# Patient Record
Sex: Female | Born: 1985 | Hispanic: Yes | Marital: Single | Smoking: Never smoker
Health system: Southern US, Community
[De-identification: ages and names within clinical notes are randomized; demographics above are authoritative.]

## PROBLEM LIST (undated history)

## (undated) DIAGNOSIS — M5412 Radiculopathy, cervical region: Secondary | ICD-10-CM

## (undated) DIAGNOSIS — M5020 Other cervical disc displacement, unspecified cervical region: Secondary | ICD-10-CM

## (undated) DIAGNOSIS — F419 Anxiety disorder, unspecified: Secondary | ICD-10-CM

## (undated) DIAGNOSIS — R519 Headache, unspecified: Secondary | ICD-10-CM

## (undated) DIAGNOSIS — F329 Major depressive disorder, single episode, unspecified: Secondary | ICD-10-CM

## (undated) DIAGNOSIS — F32A Depression, unspecified: Secondary | ICD-10-CM

## (undated) HISTORY — DX: Depression, unspecified: F32.A

## (undated) HISTORY — PX: TONSILLECTOMY: SUR1361

---
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
## 2019-07-28 ENCOUNTER — Other Ambulatory Visit: Payer: Self-pay

## 2019-07-28 ENCOUNTER — Emergency Department (INDEPENDENT_AMBULATORY_CARE_PROVIDER_SITE_OTHER)
Admission: EM | Admit: 2019-07-28 | Discharge: 2019-07-28 | Disposition: A | Discharge status: Home / Self Care | Payer: PRIVATE HEALTH INSURANCE | Source: Home / Self Care | Attending: Family Medicine | Admitting: Family Medicine

## 2019-07-28 DIAGNOSIS — J209 Acute bronchitis, unspecified: Secondary | ICD-10-CM

## 2019-07-28 DIAGNOSIS — R030 Elevated blood-pressure reading, without diagnosis of hypertension: Secondary | ICD-10-CM

## 2019-07-28 MED ORDER — BENZONATATE 200 MG PO CAPS: ORAL_CAPSULE | ORAL | 0 refills | Status: DC

## 2019-07-28 MED ORDER — AZITHROMYCIN 250 MG PO TABS: ORAL_TABLET | ORAL | 0 refills | Status: DC

## 2019-07-28 NOTE — Discharge Instructions (Addendum)
Take plain guaifenesin (1200mg  extended release tabs such as Mucinex) twice daily, with plenty of water, for cough and congestion.  Get adequate rest.   May use Afrin nasal spray (or generic oxymetazoline) each morning for about 5 days and then discontinue.  Also recommend using saline nasal spray several times daily and saline nasal irrigation (AYR is a common brand).  Use Flonase nasal spray each morning after using Afrin nasal spray and saline nasal irrigation. Try warm salt water gargles for sore throat.  Stop all antihistamines for now, and other non-prescription cough/cold preparations.  Please monitor your blood pressure several times weekly, at different times of the day, record on a calendar with times of measurement and take this record to your Family Doctor.

## 2019-07-28 NOTE — ED Triage Notes (Signed)
Pt c/o cough and runny nose since halloween. Taking mucinex and OTC nasal spray as needed. Pt also hypertensive today. Says she only takes BP when sick and its always high.

## 2019-07-28 NOTE — ED Provider Notes (Signed)
[facility]    CSN: [id number] Arrival date & time: 07/28/19  1048      History   Chief Complaint Chief Complaint  Patient presents with  . Cough    HPI Victoria Bullock is a 33 y.o. female.   About 10 days ago patient developed typical cold-like symptoms developing over several days, including mild sore throat, sinus congestion, fatigue, and cough.  Her cough has been present for a week and she has felt worse during the past two days.  She denies fevers, chills, and sweats and pleuritic pain.  She has a family history of asthma (sister)>   The history is provided by the patient.    History reviewed. No pertinent past medical history.  There are no active problems to display for this patient.   History reviewed. No pertinent surgical history.  OB History   No obstetric history on file.      Home Medications    Prior to Admission medications   Medication Sig Start Date End Date Taking? Authorizing Provider  azithromycin (ZITHROMAX Z-PAK) 250 MG tablet Take 2 tabs today; then begin one tab once daily for 4 more days. 07/28/19   Lattie Haw, MD  benzonatate (TESSALON) 200 MG capsule Take one cap by mouth at bedtime as needed for cough.  May repeat in 4 to 6 hours 07/28/19   Lattie Haw, MD    Family History History reviewed. No pertinent family history.  Social History Social History   Tobacco Use  . Smoking status: Never Smoker  . Smokeless tobacco: Never Used  Substance Use Topics  . Alcohol use: Yes    Comment: occ  . Drug use: Not on file     Allergies   Amoxicillin   Review of Systems Review of Systems + sore throat + cough No pleuritic pain No wheezing + nasal congestion + post-nasal drainage No sinus pain/pressure No itchy/red eyes No earache No hemoptysis No SOB No fever/chills No nausea No vomiting No abdominal pain No diarrhea No urinary symptoms No skin rash + fatigue No myalgias No headache Used OTC  meds (Sudafed, Daquil, Mucinex Cold/Sinus) without relief  Physical Exam Triage Vital Signs ED Triage Vitals  Enc Vitals Group     BP 07/28/19 1148 (!) 181/117     Pulse Rate 07/28/19 1148 (!) 102     Resp 07/28/19 1148 18     Temp 07/28/19 1148 98.7 F (37.1 C)     Temp Source 07/28/19 1148 Oral     SpO2 07/28/19 1148 100 %     Weight --      Height --      Head Circumference --      Peak Flow --      Pain Score 07/28/19 1149 0     Pain Loc --      Pain Edu? --      Excl. in GC? --    No data found.  Updated Vital Signs BP (!) 159/111 (BP Location: Right Arm)   Pulse (!) 102   Temp 98.7 F (37.1 C) (Oral)   Resp 18   LMP  (LMP Unknown) Comment: BC skips periods  SpO2 100%   Visual Acuity Right Eye Distance:   Left Eye Distance:   Bilateral Distance:    Right Eye Near:   Left Eye Near:    Bilateral Near:     Physical Exam Nursing notes and Vital Signs reviewed. Appearance:  Patient appears stated age, and  in no acute distress Eyes:  Pupils are equal, round, and reactive to light and accomodation.  Extraocular movement is intact.  Conjunctivae are not inflamed  Ears:  Canals normal.  Tympanic membranes normal.  Nose:  Mildly congested turbinates.  No sinus tenderness.    Pharynx:  Normal Neck:  Supple.  Enlarged posterior/lateral nodes are palpated bilaterally, tender to palpation on the left. Lungs:  Rhonchi posterior upper chest bilaterally.  Breath sounds are equal.  Moving air well. Heart:  Regular rate and rhythm without murmurs, rubs, or gallops.  Abdomen:  Nontender without masses or hepatosplenomegaly.  Bowel sounds are present.  No CVA or flank tenderness.  Extremities:  No edema.  Skin:  No rash present.      UC Treatments / Results  Labs (all labs ordered are listed, but only abnormal results are displayed) Labs Reviewed - No data to display  EKG   Radiology No results found.  Procedures Procedures (including critical care time)   Medications Ordered in UC Medications - No data to display  Initial Impression / Assessment and Plan / UC Course  I have reviewed the triage vital signs and the nursing notes.  Pertinent labs & imaging results that were available during my care of the patient were reviewed by me and considered in my medical decision making (see chart for details).    Suspect initial viral URI, now developing bacterial bronchitis.  Begin Z-pak. Prescription written for Benzonatate [facility]) to take at bedtime for night-time cough.  Followup with Family Doctor if not improved in 7 to 10 days.  Recommend follow-up with PCP for evaluation of blood pressure.   Final Clinical Impressions(s) / UC Diagnoses   Final diagnoses:  Acute bronchitis, unspecified organism  Elevated blood pressure reading in office without diagnosis of hypertension     Discharge Instructions     Take plain guaifenesin (1200mg  extended release tabs such as Mucinex) twice daily, with plenty of water, for cough and congestion.  Get adequate rest.   May use Afrin nasal spray (or generic oxymetazoline) each morning for about 5 days and then discontinue.  Also recommend using saline nasal spray several times daily and saline nasal irrigation (AYR is a common brand).  Use Flonase nasal spray each morning after using Afrin nasal spray and saline nasal irrigation. Try warm salt water gargles for sore throat.  Stop all antihistamines for now, and other non-prescription cough/cold preparations.  Please monitor your blood pressure several times weekly, at different times of the day, record on a calendar with times of measurement and take this record to your Family Doctor.       ED Prescriptions    Medication Sig Dispense Auth. Provider   azithromycin (ZITHROMAX Z-PAK) 250 MG tablet Take 2 tabs today; then begin one tab once daily for 4 more days. 6 tablet Kandra Nicolas, MD   benzonatate (TESSALON) 200 MG capsule Take one cap by mouth  at bedtime as needed for cough.  May repeat in 4 to 6 hours 15 capsule Assunta Found Ishmael Holter, MD        Kandra Nicolas, MD 08/02/19 [phone]

## 2019-08-22 ENCOUNTER — Emergency Department (INDEPENDENT_AMBULATORY_CARE_PROVIDER_SITE_OTHER)
Admission: EM | Admit: 2019-08-22 | Discharge: 2019-08-22 | Disposition: A | Discharge status: Home / Self Care | Payer: PRIVATE HEALTH INSURANCE | Source: Home / Self Care

## 2019-08-22 ENCOUNTER — Other Ambulatory Visit: Payer: Self-pay

## 2019-08-22 DIAGNOSIS — M436 Torticollis: Secondary | ICD-10-CM

## 2019-08-22 MED ORDER — DICLOFENAC SODIUM 75 MG PO TBEC: 75.0000 mg | DELAYED_RELEASE_TABLET | Freq: Two times a day (BID) | ORAL | 0 refills | Status: DC

## 2019-08-22 MED ORDER — METHOCARBAMOL 500 MG PO TABS: 500.0000 mg | ORAL_TABLET | Freq: Four times a day (QID) | ORAL | 0 refills | Status: DC

## 2019-08-22 MED ORDER — KETOROLAC TROMETHAMINE 60 MG/2ML IM SOLN
60.0000 mg | Freq: Once | INTRAMUSCULAR | Status: AC
  Administered 2019-08-22: 60 mg via INTRAMUSCULAR

## 2019-08-22 NOTE — Discharge Instructions (Signed)
Return if any problems.

## 2019-08-22 NOTE — ED Triage Notes (Signed)
Pt c/o LT sided back pain that radiates to her LT shoulder. Started randomly at 400am. Took ibuprofen at 8am. Pain 6/10

## 2019-08-23 NOTE — ED Provider Notes (Signed)
[facility]    CSN: [id number] Arrival date & time: 08/22/19  0945      History   Chief Complaint Chief Complaint  Patient presents with  . Back Pain  . Shoulder Pain    HPI Victoria Bullock is a 33 y.o. female.   The history is provided by the patient. No language interpreter was used.  Neck Injury This is a new problem. The current episode started 2 days ago. The problem occurs constantly. Nothing aggravates the symptoms. Nothing relieves the symptoms. She has tried nothing for the symptoms. The treatment provided no relief.  Pt complains of soreness and muscle spasm in her neck.  Pt reports she awoke from sleep with pain.  No known injury  History reviewed. No pertinent past medical history.  There are no active problems to display for this patient.   History reviewed. No pertinent surgical history.  OB History   No obstetric history on file.      Home Medications    Prior to Admission medications   Medication Sig Start Date End Date Taking? Authorizing Provider  azithromycin (ZITHROMAX Z-PAK) 250 MG tablet Take 2 tabs today; then begin one tab once daily for 4 more days. 07/28/19   Lattie Haw, MD  benzonatate (TESSALON) 200 MG capsule Take one cap by mouth at bedtime as needed for cough.  May repeat in 4 to 6 hours 07/28/19   Lattie Haw, MD  diclofenac (VOLTAREN) 75 MG EC tablet Take 1 tablet (75 mg total) by mouth 2 (two) times daily. 08/22/19   Elson Areas, PA-C  methocarbamol (ROBAXIN) 500 MG tablet Take 1 tablet (500 mg total) by mouth 4 (four) times daily. 08/22/19   Elson Areas, PA-C    Family History History reviewed. No pertinent family history.  Social History Social History   Tobacco Use  . Smoking status: Never Smoker  . Smokeless tobacco: Never Used  Substance Use Topics  . Alcohol use: Yes    Comment: occ  . Drug use: Not on file     Allergies   Amoxicillin   Review of Systems Review of Systems  All  other systems reviewed and are negative.    Physical Exam Triage Vital Signs ED Triage Vitals [08/22/19 1003]  Enc Vitals Group     BP (!) 150/96     Pulse Rate (!) 120     Resp 18     Temp 98.5 F (36.9 C)     Temp Source Oral     SpO2 97 %     Weight 215 lb (97.5 kg)     Height 5\' 5"  (1.651 m)     Head Circumference      Peak Flow      Pain Score 6     Pain Loc      Pain Edu?      Excl. in GC?    No data found.  Updated Vital Signs BP (!) 150/96 (BP Location: Right Arm)   Pulse (!) 120   Temp 98.5 F (36.9 C) (Oral)   Resp 18   Ht 5\' 5"  (1.651 m)   Wt 97.5 kg   LMP  (LMP Unknown) Comment: BC skips periods  SpO2 97%   BMI 35.78 kg/m   Visual Acuity Right Eye Distance:   Left Eye Distance:   Bilateral Distance:    Right Eye Near:   Left Eye Near:    Bilateral Near:     Physical  Exam Vitals signs and nursing note reviewed.  Constitutional:      Appearance: She is well-developed.  HENT:     Head: Normocephalic.  Neck:     Musculoskeletal: Normal range of motion. Muscular tenderness present. No neck rigidity.     Comments: Tense trapezius muscles, decreased range of motion, nv and ns intact Pulmonary:     Effort: Pulmonary effort is normal.  Abdominal:     General: There is no distension.  Musculoskeletal: Normal range of motion.  Skin:    General: Skin is warm.  Neurological:     General: No focal deficit present.     Mental Status: She is alert and oriented to person, place, and time.  Psychiatric:        Mood and Affect: Mood normal.      UC Treatments / Results  Labs (all labs ordered are listed, but only abnormal results are displayed) Labs Reviewed - No data to display  EKG   Radiology No results found.  Procedures Procedures (including critical care time)  Medications Ordered in UC Medications  ketorolac (TORADOL) injection 60 mg (60 mg Intramuscular Given 08/22/19 1036)    Initial Impression / Assessment and Plan / UC  Course  I have reviewed the triage vital signs and the nursing notes.  Pertinent labs & imaging results that were available during my care of the patient were reviewed by me and considered in my medical decision making (see chart for details).     MDM  Pt counseled on torticolilis.  Pt given injection of toradol, rx for robaxin and voltaren Final Clinical Impressions(s) / UC Diagnoses   Final diagnoses:  Torticollis     Discharge Instructions     Return if any problems    ED Prescriptions    Medication Sig Dispense Auth. Provider   methocarbamol (ROBAXIN) 500 MG tablet Take 1 tablet (500 mg total) by mouth 4 (four) times daily. 20 tablet Roel Douthat K, [place]   diclofenac (VOLTAREN) 75 MG EC tablet Take 1 tablet (75 mg total) by mouth 2 (two) times daily. 20 tablet Fransico Meadow, [place]     PDMP not reviewed this encounter.  An After Visit Summary was printed and given to the patient.    Fransico Meadow, [place] 08/23/19 1310

## 2019-08-24 ENCOUNTER — Other Ambulatory Visit: Payer: Self-pay

## 2019-08-24 ENCOUNTER — Emergency Department (INDEPENDENT_AMBULATORY_CARE_PROVIDER_SITE_OTHER): Payer: PRIVATE HEALTH INSURANCE

## 2019-08-24 ENCOUNTER — Emergency Department (INDEPENDENT_AMBULATORY_CARE_PROVIDER_SITE_OTHER)
Admission: EM | Admit: 2019-08-24 | Discharge: 2019-08-24 | Disposition: A | Discharge status: Home / Self Care | Payer: PRIVATE HEALTH INSURANCE | Source: Home / Self Care | Attending: Family Medicine | Admitting: Family Medicine

## 2019-08-24 DIAGNOSIS — M5412 Radiculopathy, cervical region: Secondary | ICD-10-CM

## 2019-08-24 DIAGNOSIS — M4312 Spondylolisthesis, cervical region: Secondary | ICD-10-CM | POA: Diagnosis not present

## 2019-08-24 MED ORDER — PREDNISONE 20 MG PO TABS: ORAL_TABLET | ORAL | 0 refills | Status: DC

## 2019-08-24 MED ORDER — OXYCODONE-ACETAMINOPHEN 5-325 MG PO TABS: ORAL_TABLET | ORAL | 0 refills | Status: DC

## 2019-08-24 NOTE — Discharge Instructions (Addendum)
Apply ice pack for 20 to 30 minutes, 3 to 4 times daily  Continue until pain decreases.  Wear cervical collar.

## 2019-08-24 NOTE — ED Triage Notes (Signed)
Pt c/o LT shoulder, back and neck pain since Thurs am. Was seen here in UC and given Toradol injection. Sent home with robaxin and diclofenac. No relief since and now has numbness starting in her fingers.

## 2019-08-24 NOTE — ED Provider Notes (Signed)
[facility]    CSN: [id number] Arrival date & time: 08/24/19  0844      History   Chief Complaint Chief Complaint  Patient presents with  . Shoulder Pain    LT  . Back Pain  . Numbness    LT fingers    HPI Victoria Bullock is a 33 y.o. female.   Patient complains of at least 5 year history of intermittently recurring pain in left neck and shoulder, now constant for two days.  The pain now radiates to her upper left anterior chest and into left arm, with tingling paresthesias in her left fingers.  The symptoms are distinctly worse when extending her neck, rotating her head to the left, and laterally flexing her neck to the left.  Her pain improves when touching her chin on her chest.  She denies past injury to neck. She was evaluated here two days ago, and had no improvement after injection Toradol, and Rx for Voltaren and Robaxin.      History reviewed. No pertinent past medical history.  There are no active problems to display for this patient.   History reviewed. No pertinent surgical history.  OB History   No obstetric history on file.      Home Medications    Prior to Admission medications   Medication Sig Start Date End Date Taking? Authorizing Provider  azithromycin (ZITHROMAX Z-PAK) 250 MG tablet Take 2 tabs today; then begin one tab once daily for 4 more days. 07/28/19   Kandra Nicolas, MD  benzonatate (TESSALON) 200 MG capsule Take one cap by mouth at bedtime as needed for cough.  May repeat in 4 to 6 hours 07/28/19   Kandra Nicolas, MD  diclofenac (VOLTAREN) 75 MG EC tablet Take 1 tablet (75 mg total) by mouth 2 (two) times daily. 08/22/19   Fransico Meadow, PA-C  methocarbamol (ROBAXIN) 500 MG tablet Take 1 tablet (500 mg total) by mouth 4 (four) times daily. 08/22/19   Fransico Meadow, PA-C  oxyCODONE-acetaminophen (PERCOCET) 5-325 MG tablet Take one tab PO HS PRN pain 08/24/19   Kandra Nicolas, MD  predniSONE (DELTASONE) 20 MG tablet Take  one tab by mouth twice daily for 4 days, then one daily for 3 days. Take with food. 08/24/19   Kandra Nicolas, MD    Family History History reviewed. No pertinent family history.  Social History Social History   Tobacco Use  . Smoking status: Never Smoker  . Smokeless tobacco: Never Used  Substance Use Topics  . Alcohol use: Yes    Comment: occ  . Drug use: Not on file     Allergies   Amoxicillin   Review of Systems Review of Systems  Constitutional: Negative for activity change, chills, diaphoresis, fatigue and fever.  HENT: Negative.   Eyes: Negative.   Respiratory: Negative.   Cardiovascular: Negative.   Gastrointestinal: Negative.   Genitourinary: Negative.   Musculoskeletal: Positive for neck pain and neck stiffness.  Skin: Negative.   Neurological: Positive for numbness.     Physical Exam Triage Vital Signs ED Triage Vitals  Enc Vitals Group     BP 08/24/19 0853 (!) 155/96     Pulse Rate 08/24/19 0853 (!) 114     Resp 08/24/19 0853 18     Temp 08/24/19 0853 98.4 F (36.9 C)     Temp Source 08/24/19 0853 Oral     SpO2 08/24/19 0853 98 %     Weight 08/24/19  0854 213 lb (96.6 kg)     Height 08/24/19 0854 5\' 5"  (1.651 m)     Head Circumference --      Peak Flow --      Pain Score 08/24/19 0854 6     Pain Loc --      Pain Edu? --      Excl. in GC? --    No data found.  Updated Vital Signs BP (!) 155/96 (BP Location: Right Arm)   Pulse (!) 114   Temp 98.4 F (36.9 C) (Oral)   Resp 18   Ht 5\' 5"  (1.651 m)   Wt 96.6 kg   LMP  (LMP Unknown) Comment: BC skips periods  SpO2 98%   BMI 35.45 kg/m   Visual Acuity Right Eye Distance:   Left Eye Distance:   Bilateral Distance:    Right Eye Near:   Left Eye Near:    Bilateral Near:     Physical Exam Vitals signs and nursing note reviewed.  Constitutional:      General: She is not in acute distress.    Appearance: She is obese.     Comments: Patient appears uncomfortable, standing by exam  table.  HENT:     Head: Atraumatic.     Right Ear: Tympanic membrane normal.     Left Ear: Tympanic membrane normal.     Nose: Nose normal.     Mouth/Throat:     Mouth: Mucous membranes are moist.  Eyes:     Conjunctiva/sclera: Conjunctivae normal.     Pupils: Pupils are equal, round, and reactive to light.  Neck:     Musculoskeletal: Neck supple. Decreased range of motion. Spinous process tenderness and muscular tenderness present. No crepitus.      Comments: Left neck has diffuse tenderness to palpation in areas  as noted on diagram.  Cardiovascular:     Heart sounds: Normal heart sounds.  Pulmonary:     Breath sounds: Normal breath sounds.  Musculoskeletal:     Right shoulder: She exhibits tenderness. She exhibits normal range of motion and no bony tenderness.  Lymphadenopathy:     Cervical: No cervical adenopathy.  Skin:    General: Skin is warm and dry.     Findings: No rash.  Neurological:     Mental Status: She is alert.     Comments: Normal grip strength bilaterally      UC Treatments / Results  Labs (all labs ordered are listed, but only abnormal results are displayed) Labs Reviewed - No data to display  EKG   Radiology Dg Cervical Spine Complete  Result Date: 08/24/2019 CLINICAL DATA:  Pt states that for the past 3 days she has had left sided neck pain with numbness and tingling in her left arm. Denies injury. Pt states pain increases when tilting head up. Intermittent left neck/shoulder pain for at least 5 years. Now has constant pain radiating to upper left anterior chest and left arm. EXAM: CERVICAL SPINE - COMPLETE 4+ VIEW COMPARISON:  None. FINDINGS: Straightening of the normal cervical lordosis. Mild 1 mm of anterolisthesis of C2 on C3. No neural foraminal narrowing on oblique projections. No loss vertebral body height and disc height. Normal spinal laminal line. Open mouth odontoid view demonstrates normal alignment of the lateral masses of C1 on C2  IMPRESSION: 1. No acute findings the cervical spine. 2. Mild anterolisthesis of C2 on C3. Electronically Signed   By: Genevive BiStewart  Edmunds M.D.   On: 08/24/2019 09:58  Procedures Procedures (including critical care time)  Medications Ordered in UC Medications - No data to display  Initial Impression / Assessment and Plan / UC Course  I have reviewed the triage vital signs and the nursing notes.  Pertinent labs & imaging results that were available during my care of the patient were reviewed by me and considered in my medical decision making (see chart for details).    Begin prednisone burst/taper.  Rx for Percocet at bedtime (#5, no refill). Controlled Substance Prescriptions I have consulted the [place] Controlled Substances Registry for this patient, and feel the risk/benefit ratio today is favorable for proceeding with this prescription for a controlled substance.   Followup with Dr. [facility]ney [facility] ([facility]) in about 5 days for further evaluation/management.   Final Clinical Impressions(s) / UC Diagnoses   Final diagnoses:  Cervical radiculopathy     Discharge Instructions     Apply ice pack for 20 to 30 minutes, 3 to 4 times daily  Continue until pain decreases.  Wear cervical collar.    ED Prescriptions    Medication Sig Dispense Auth. Provider   predniSONE (DELTASONE) 20 MG tablet Take one tab by mouth twice daily for 4 days, then one daily for 3 days. Take with food. 11 tablet Lattie Haw, MD   oxyCODONE-acetaminophen (PERCOCET) 5-325 MG tablet Take one tab PO HS PRN pain 5 tablet Lattie Haw, MD        Lattie Haw, MD 08/30/19 1046

## 2019-08-27 ENCOUNTER — Ambulatory Visit (INDEPENDENT_AMBULATORY_CARE_PROVIDER_SITE_OTHER): Payer: PRIVATE HEALTH INSURANCE | Admitting: Sports Medicine

## 2019-08-27 ENCOUNTER — Encounter: Payer: Self-pay | Admitting: Sports Medicine

## 2019-08-27 ENCOUNTER — Other Ambulatory Visit: Payer: Self-pay

## 2019-08-27 ENCOUNTER — Ambulatory Visit (INDEPENDENT_AMBULATORY_CARE_PROVIDER_SITE_OTHER): Payer: PRIVATE HEALTH INSURANCE

## 2019-08-27 DIAGNOSIS — M5412 Radiculopathy, cervical region: Secondary | ICD-10-CM

## 2019-08-27 MED ORDER — CYCLOBENZAPRINE HCL 10 MG PO TABS: ORAL_TABLET | ORAL | 0 refills | Status: DC

## 2019-08-27 MED ORDER — OXYCODONE-ACETAMINOPHEN 10-325 MG PO TABS: 1.0000 | ORAL_TABLET | Freq: Three times a day (TID) | ORAL | 0 refills | Status: DC | PRN

## 2019-08-27 MED ORDER — KETOROLAC TROMETHAMINE 60 MG/2ML IM SOLN
60.0000 mg | Freq: Once | INTRAMUSCULAR | Status: AC
  Administered 2019-08-27: 60 mg via INTRAMUSCULAR

## 2019-08-27 NOTE — Progress Notes (Addendum)
Subjective:    CC: Severe neck pain  HPI:  This is a pleasant 33 year old female, on and off she is had flares of neck pain, occasional radiation into the left arm but this is the worst yet, she was seen in urgent care, 2 times in the past week, she has severe pain, radiating into the second and third fingers, has never had physical therapy or advanced imaging.  She does have progressive weakness.  I reviewed the past medical history, family history, social history, surgical history, and allergies today and no changes were needed.  Please see the problem list section below in epic for further details.  Past Medical History: Past Medical History:  Diagnosis Date  . Depression    Past Surgical History: Past Surgical History:  Procedure Laterality Date  . TONSILLECTOMY     Social History: Social History   Socioeconomic History  . Marital status: Single    Spouse name: Not on file  . Number of children: Not on file  . Years of education: Not on file  . Highest education level: Not on file  Occupational History  . Not on file  Social Needs  . Financial resource strain: Not on file  . Food insecurity    Worry: Not on file    Inability: Not on file  . Transportation needs    Medical: Not on file    Non-medical: Not on file  Tobacco Use  . Smoking status: Never Smoker  . Smokeless tobacco: Never Used  Substance and Sexual Activity  . Alcohol use: Yes    Comment: occ  . Drug use: Not on file  . Sexual activity: Not on file  Lifestyle  . Physical activity    Days per week: Not on file    Minutes per session: Not on file  . Stress: Not on file  Relationships  . Social Herbalist on phone: Not on file    Gets together: Not on file    Attends religious service: Not on file    Active member of club or organization: Not on file    Attends meetings of clubs or organizations: Not on file    Relationship status: Not on file  Other Topics Concern  . Not on file   Social History Narrative  . Not on file   Family History: No family history on file. Allergies: Allergies  Allergen Reactions  . Amoxicillin    Medications: See med rec.  Review of Systems: No headache, visual changes, nausea, vomiting, diarrhea, constipation, dizziness, abdominal pain, skin rash, fevers, chills, night sweats, swollen lymph nodes, weight loss, chest pain, body aches, joint swelling, muscle aches, shortness of breath, mood changes, visual or auditory hallucinations.  Objective:    General: Well Developed, well nourished, and in no acute distress.  Neuro: Alert and oriented x3, extra-ocular muscles intact, sensation grossly intact.  HEENT: Normocephalic, atraumatic, pupils equal round reactive to light, neck supple, no masses, no lymphadenopathy, thyroid nonpalpable.  Skin: Warm and dry, no rashes noted.  Cardiac: Regular rate and rhythm, no murmurs rubs or gallops.  Respiratory: Clear to auscultation bilaterally. Not using accessory muscles, speaking in full sentences.  Abdominal: Soft, nontender, nondistended, positive bowel sounds, no masses, no organomegaly.  Neck: Range of motion limited by pain, unable to perform Spurling's maneuver due to pain. Grip strength and sensation normal in bilateral hands Week to elbow extension and wrist extension. Subjective hypoesthesia in a C7 distribution on the left. Reflexes normal  X-rays personally reviewed show mild degenerative changes in the upper cervical spine.  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  Impression and Recommendations:    The patient was counselled, risk factors were discussed, anticipatory guidance given.  Left cervical radiculopathy Severe left cervical radiculopathy, weakness in the left arm, particularly to wrist extension and elbow extension. Continue prednisone, Toradol 60 intramuscular. Adding an MRI today considering weakness. Adding formal physical  therapy.  There is a large herniated disc at C6-C7 definitely compressing the left C7 nerve root, with her weakness I do think it is reasonable to get a consultation with spine surgery, placing urgent referral to Dr. Yevette Edwards, let see if he can see her today or tomorrow.   ___________________________________________ Ihor Austin. Benjamin Stain, M.D., ABFM., CAQSM. Primary Care and Sports Medicine [facility]  Adjunct Professor of Family Medicine  [employer]

## 2019-08-27 NOTE — Addendum Note (Signed)
Addended by: Silverio Decamp on: 08/27/2019 01:16 PM   Modules accepted: Orders

## 2019-08-27 NOTE — Assessment & Plan Note (Addendum)
Severe left cervical radiculopathy, weakness in the left arm, particularly to wrist extension and elbow extension. Continue prednisone, Toradol 60 intramuscular. Adding an MRI today considering weakness. Adding formal physical therapy.  There is a large herniated disc at C6-C7 definitely compressing the left C7 nerve root, with her weakness I do think it is reasonable to get a consultation with spine surgery, placing urgent referral to Dr. Lynann Bologna, let see if he can see her today or tomorrow.

## 2019-08-28 ENCOUNTER — Encounter: Payer: Self-pay | Admitting: Sports Medicine

## 2019-09-03 ENCOUNTER — Other Ambulatory Visit: Payer: Self-pay | Admitting: Orthopedic Surgery

## 2019-09-05 NOTE — Pre-Procedure Instructions (Signed)
Daron Breeding  09/05/2019     Your procedure is scheduled on Tuesday, September 10, 2019 at 1:10 PM.   Report to [facility] Entrance "A" Admitting Office at 10:10 AM.   Call this number if you have problems the morning of surgery: [phone]   Questions prior to day of surgery, please call [phone] between 8 & 4 PM.   Remember:  Do not eat food after midnight Monday, 09/09/19.  You may drink clear liquids until 10:10 AM.  Clear liquids allowed are:  Water, Juice (non-citric and without pulp), Carbonated beverages, Clear Tea, Black Coffee only, Plain Jell-O only, Gatorade and Plain Popsicles only   Drink the Pre-Surgery Ensure between 10:00 AM and 10:10 AM, day of surgery.    Take these medicines the morning of surgery with A SIP OF WATER: Cyclobenzaprine (Flexeril), Ortho Cyclen, Percocet - if needed or Tylenol - if needed.  Stop Herbal Medications (Melatonin, etc) and NSAIDS (Ibuprofen, Aleve, etc) as of today prior to surgery. Do not use Aspirin products (BC Powders, Goody's, etc), Multivitamins or Fish Oil prior to surgery.    Do not wear jewelry, make-up or nail polish.  Do not wear lotions, powders, perfumes or deodorant.  Do not shave 48 hours prior to surgery.    Do not bring valuables to the hospital.  [facility] is not responsible for any belongings or valuables.  Contacts, dentures or bridgework may not be worn into surgery.  Leave your suitcase in the car.  After surgery it may be brought to your room.  For patients admitted to the hospital, discharge time will be determined by your treatment team.  [facility] - Preparing for Surgery  Before surgery, you can play an important role.  Because skin is not sterile, your skin needs to be as free of germs as possible.  You can reduce the number of germs on you skin by washing with CHG (chlorahexidine gluconate) soap before surgery.  CHG is an antiseptic cleaner which kills germs and bonds with the skin  to continue killing germs even after washing.  Oral Hygiene is also important in reducing the risk of infection.  Remember to brush your teeth with your regular toothpaste the morning of surgery.  Please DO NOT use if you have an allergy to CHG or antibacterial soaps.  If your skin becomes reddened/irritated stop using the CHG and inform your nurse when you arrive at Short Stay.  Do not shave (including legs and underarms) for at least 48 hours prior to the first CHG shower.  You may shave your face.  Please follow these instructions carefully:   1.  Shower with CHG Soap the night before surgery and the morning of Surgery.  2.  If you choose to wash your hair, wash your hair first as usual with your normal shampoo.  3.  After you shampoo, rinse your hair and body thoroughly to remove the shampoo. 4.  Use CHG as you would any other liquid soap.  You can apply chg directly to the skin and wash gently with a      scrungie or washcloth.           5.  Apply the CHG Soap to your body ONLY FROM THE NECK DOWN.   Do not use on open wounds or open sores. Avoid contact with your eyes, ears, mouth and genitals (private parts).  Wash genitals (private parts) with your normal soap - do this prior to using CHG soap.  6.  Wash thoroughly, paying special attention to the area where your surgery will be performed.  7.  Thoroughly rinse your body with warm water from the neck down.  8.  DO NOT shower/wash with your normal soap after using and rinsing off the CHG Soap.  9.  Pat yourself dry with a clean towel.            10.  Wear clean pajamas.            11.  Place clean sheets on your bed the night of your first shower and do not sleep with pets.  Day of Surgery  Shower as above. Do not apply any lotions/deodorants the morning of surgery.   Please wear clean clothes to the hospital. Remember to brush your teeth with toothpaste.  Please read over the fact sheets that you were given.

## 2019-09-06 ENCOUNTER — Encounter (HOSPITAL_COMMUNITY): Payer: Self-pay

## 2019-09-06 ENCOUNTER — Encounter (HOSPITAL_COMMUNITY)
Admission: RE | Admit: 2019-09-06 | Discharge: 2019-09-06 | Disposition: A | Discharge status: Home / Self Care | Payer: No Typology Code available for payment source | Source: Ambulatory Visit | Attending: Orthopedic Surgery | Admitting: Orthopedic Surgery

## 2019-09-06 ENCOUNTER — Other Ambulatory Visit (HOSPITAL_COMMUNITY)
Admission: RE | Admit: 2019-09-06 | Discharge: 2019-09-06 | Disposition: A | Discharge status: Home / Self Care | Payer: No Typology Code available for payment source | Source: Ambulatory Visit | Attending: Orthopedic Surgery | Admitting: Orthopedic Surgery

## 2019-09-06 ENCOUNTER — Other Ambulatory Visit: Payer: Self-pay

## 2019-09-06 DIAGNOSIS — Z20828 Contact with and (suspected) exposure to other viral communicable diseases: Secondary | ICD-10-CM | POA: Insufficient documentation

## 2019-09-06 DIAGNOSIS — Z01812 Encounter for preprocedural laboratory examination: Secondary | ICD-10-CM | POA: Diagnosis not present

## 2019-09-06 DIAGNOSIS — M5011 Cervical disc disorder with radiculopathy,  high cervical region: Secondary | ICD-10-CM | POA: Insufficient documentation

## 2019-09-06 HISTORY — DX: Anxiety disorder, unspecified: F41.9

## 2019-09-06 HISTORY — DX: Headache, unspecified: R51.9

## 2019-09-06 LAB — URINALYSIS, ROUTINE W REFLEX MICROSCOPIC
Bilirubin Urine: NEGATIVE
Glucose, UA: NEGATIVE mg/dL
Ketones, ur: NEGATIVE mg/dL
Leukocytes,Ua: NEGATIVE
Nitrite: NEGATIVE
Protein, ur: NEGATIVE mg/dL
Specific Gravity, Urine: 1.014 (ref 1.005–1.030)
pH: 5 (ref 5.0–8.0)

## 2019-09-06 LAB — COMPREHENSIVE METABOLIC PANEL
ALT: 15 U/L (ref 0–44)
AST: 15 U/L (ref 15–41)
Albumin: 3.9 g/dL (ref 3.5–5.0)
Alkaline Phosphatase: 45 U/L (ref 38–126)
Anion gap: 11 (ref 5–15)
BUN: 6 mg/dL (ref 6–20)
CO2: 25 mmol/L (ref 22–32)
Calcium: 9.3 mg/dL (ref 8.9–10.3)
Chloride: 101 mmol/L (ref 98–111)
Creatinine, Ser: 0.77 mg/dL (ref 0.44–1.00)
GFR calc Af Amer: >60 mL/min (ref >60)
GFR calc non Af Amer: >60 mL/min (ref >60)
Glucose, Bld: 102 mg/dL — ABNORMAL HIGH (ref 70–99)
Potassium: 3.6 mmol/L (ref 3.5–5.1)
Sodium: 137 mmol/L (ref 135–145)
Total Bilirubin: 0.4 mg/dL (ref 0.3–1.2)
Total Protein: 7.3 g/dL (ref 6.5–8.1)

## 2019-09-06 LAB — CBC WITH DIFFERENTIAL/PLATELET
Abs Immature Granulocytes: 0.04 10*3/uL (ref 0.00–0.07)
Basophils Absolute: 0 10*3/uL (ref 0.0–0.1)
Basophils Relative: 0 %
Eosinophils Absolute: 0.3 10*3/uL (ref 0.0–0.5)
Eosinophils Relative: 3 %
HCT: 41.3 % (ref 36.0–46.0)
Hemoglobin: 13.3 g/dL (ref 12.0–15.0)
Immature Granulocytes: 0 %
Lymphocytes Relative: 21 %
Lymphs Abs: 2.4 10*3/uL (ref 0.7–4.0)
MCH: 30.4 pg (ref 26.0–34.0)
MCHC: 32.2 g/dL (ref 30.0–36.0)
MCV: 94.3 fL (ref 80.0–100.0)
Monocytes Absolute: 0.6 10*3/uL (ref 0.1–1.0)
Monocytes Relative: 5 %
Neutro Abs: 7.9 10*3/uL — ABNORMAL HIGH (ref 1.7–7.7)
Neutrophils Relative %: 71 %
Platelets: 311 10*3/uL (ref 150–400)
RBC: 4.38 MIL/uL (ref 3.87–5.11)
RDW: 12.1 % (ref 11.5–15.5)
WBC: 11.2 10*3/uL — ABNORMAL HIGH (ref 4.0–10.5)
nRBC: 0 % (ref 0.0–0.2)

## 2019-09-06 LAB — PROTIME-INR
INR: 1.1 (ref 0.8–1.2)
Prothrombin Time: 13.7 seconds (ref 11.4–15.2)

## 2019-09-06 LAB — SURGICAL PCR SCREEN
MRSA, PCR: NEGATIVE
Staphylococcus aureus: NEGATIVE

## 2019-09-06 LAB — APTT: aPTT: 29 seconds (ref 24–36)

## 2019-09-06 NOTE — Progress Notes (Signed)
PCP - none  Chest x-ray - n/a EKG - n/a Stress Test - n/a ECHO - n/a Cardiac Cath - n/a  Sleep Study - denies CPAP -   Fasting Blood Sugar - n/a Checks Blood Sugar _____ times a day  Blood Thinner Instructions: n/a Aspirin Instructions: n/a  ERAS Protcol - yes PRE-SURGERY Ensure or G2- Pre-Surgery Ensure  COVID TEST- scheduled for today.  Pt's heart rate and BP elevated today, pt states it is always that way at a medical facility. States she is usually in pain when she goes to see a physician. Does not have a PCP for well visits.    Anesthesia review: No  Patient denies shortness of breath, fever, cough and chest pain at PAT appointment   All instructions explained to the patient, with a verbal understanding of the material. Patient agrees to go over the instructions while at home for a better understanding. Patient also instructed to self quarantine after being tested for COVID-19. The opportunity to ask questions was provided.

## 2019-09-07 LAB — NOVEL CORONAVIRUS, NAA (HOSP ORDER, SEND-OUT TO REF LAB; TAT 18-24 HRS): SARS-CoV-2, NAA: NOT DETECTED

## 2019-09-10 ENCOUNTER — Ambulatory Visit (HOSPITAL_COMMUNITY)
Admission: RE | Admit: 2019-09-10 | Discharge: 2019-09-11 | Disposition: A | Discharge status: Home / Self Care | Payer: PRIVATE HEALTH INSURANCE | Attending: Orthopedic Surgery | Admitting: Orthopedic Surgery

## 2019-09-10 ENCOUNTER — Ambulatory Visit (HOSPITAL_COMMUNITY): Payer: PRIVATE HEALTH INSURANCE | Admitting: Certified Registered"

## 2019-09-10 ENCOUNTER — Other Ambulatory Visit: Payer: Self-pay

## 2019-09-10 ENCOUNTER — Ambulatory Visit (HOSPITAL_COMMUNITY): Payer: PRIVATE HEALTH INSURANCE | Admitting: Vascular Surgery

## 2019-09-10 ENCOUNTER — Encounter (HOSPITAL_COMMUNITY)
Admission: RE | Disposition: A | Discharge status: Home / Self Care | Payer: Self-pay | Source: Home / Self Care | Attending: Orthopedic Surgery

## 2019-09-10 ENCOUNTER — Encounter (HOSPITAL_COMMUNITY): Payer: Self-pay | Admitting: Orthopedic Surgery

## 2019-09-10 ENCOUNTER — Ambulatory Visit (HOSPITAL_COMMUNITY): Payer: PRIVATE HEALTH INSURANCE

## 2019-09-10 DIAGNOSIS — M50123 Cervical disc disorder at C6-C7 level with radiculopathy: Secondary | ICD-10-CM | POA: Diagnosis not present

## 2019-09-10 DIAGNOSIS — Z419 Encounter for procedure for purposes other than remedying health state, unspecified: Secondary | ICD-10-CM

## 2019-09-10 DIAGNOSIS — Z793 Long term (current) use of hormonal contraceptives: Secondary | ICD-10-CM | POA: Diagnosis not present

## 2019-09-10 DIAGNOSIS — M79602 Pain in left arm: Secondary | ICD-10-CM | POA: Diagnosis present

## 2019-09-10 DIAGNOSIS — G959 Disease of spinal cord, unspecified: Secondary | ICD-10-CM | POA: Diagnosis present

## 2019-09-10 HISTORY — PX: ANTERIOR CERVICAL DECOMP/DISCECTOMY FUSION: SHX1161

## 2019-09-10 HISTORY — DX: Other cervical disc displacement, unspecified cervical region: M50.20

## 2019-09-10 HISTORY — DX: Radiculopathy, cervical region: M54.12

## 2019-09-10 LAB — POCT PREGNANCY, URINE: Preg Test, Ur: NEGATIVE

## 2019-09-10 SURGERY — ANTERIOR CERVICAL DECOMPRESSION/DISCECTOMY FUSION 1 LEVEL
Anesthesia: General | Site: Spine Cervical

## 2019-09-10 MED ORDER — MELATONIN 3 MG PO TABS
6.0000 mg | ORAL_TABLET | Freq: Every evening | ORAL | Status: DC | PRN
  Filled 2019-09-10: qty 2

## 2019-09-10 MED ORDER — FENTANYL CITRATE (PF) 250 MCG/5ML IJ SOLN
INTRAMUSCULAR | Status: AC
  Filled 2019-09-10: qty 5

## 2019-09-10 MED ORDER — SUGAMMADEX SODIUM 200 MG/2ML IV SOLN
INTRAVENOUS | Status: DC | PRN
  Administered 2019-09-10: 200 mg via INTRAVENOUS

## 2019-09-10 MED ORDER — CYCLOBENZAPRINE HCL 10 MG PO TABS
10.0000 mg | ORAL_TABLET | Freq: Three times a day (TID) | ORAL | Status: DC
  Administered 2019-09-10: 10 mg via ORAL
  Filled 2019-09-10: qty 1

## 2019-09-10 MED ORDER — DEXAMETHASONE SODIUM PHOSPHATE 10 MG/ML IJ SOLN
INTRAMUSCULAR | Status: AC
  Filled 2019-09-10: qty 1

## 2019-09-10 MED ORDER — ONDANSETRON HCL 4 MG PO TABS: 4.0000 mg | ORAL_TABLET | Freq: Four times a day (QID) | ORAL | Status: DC | PRN

## 2019-09-10 MED ORDER — PROPOFOL 10 MG/ML IV BOLUS
INTRAVENOUS | Status: AC
  Filled 2019-09-10: qty 20

## 2019-09-10 MED ORDER — ACETAMINOPHEN 500 MG PO TABS
1000.0000 mg | ORAL_TABLET | Freq: Once | ORAL | Status: AC
  Administered 2019-09-10: 500 mg via ORAL
  Filled 2019-09-10: qty 2

## 2019-09-10 MED ORDER — THROMBIN 20000 UNITS EX SOLR
CUTANEOUS | Status: AC
  Filled 2019-09-10: qty 20000

## 2019-09-10 MED ORDER — SODIUM CHLORIDE 0.9% FLUSH
3.0000 mL | Freq: Two times a day (BID) | INTRAVENOUS | Status: DC
  Administered 2019-09-10: 21:00:00 3 mL via INTRAVENOUS

## 2019-09-10 MED ORDER — SODIUM CHLORIDE 0.9% FLUSH: 3.0000 mL | INTRAVENOUS | Status: DC | PRN

## 2019-09-10 MED ORDER — MIDAZOLAM HCL 2 MG/2ML IJ SOLN
INTRAMUSCULAR | Status: AC
  Filled 2019-09-10: qty 2

## 2019-09-10 MED ORDER — ALUM & MAG HYDROXIDE-SIMETH 200-200-20 MG/5ML PO SUSP: 30.0000 mL | Freq: Four times a day (QID) | ORAL | Status: DC | PRN

## 2019-09-10 MED ORDER — FENTANYL CITRATE (PF) 100 MCG/2ML IJ SOLN
INTRAMUSCULAR | Status: DC | PRN
  Administered 2019-09-10: 100 ug via INTRAVENOUS
  Administered 2019-09-10 (×6): 50 ug via INTRAVENOUS
  Administered 2019-09-10: 100 ug via INTRAVENOUS

## 2019-09-10 MED ORDER — PROPOFOL 10 MG/ML IV BOLUS
INTRAVENOUS | Status: DC | PRN
  Administered 2019-09-10: 120 mg via INTRAVENOUS

## 2019-09-10 MED ORDER — MELATONIN 5 MG PO CAPS: 5.0000 mg | ORAL_CAPSULE | Freq: Every evening | ORAL | Status: DC | PRN

## 2019-09-10 MED ORDER — LACTATED RINGERS IV SOLN: INTRAVENOUS | Status: DC

## 2019-09-10 MED ORDER — SENNOSIDES-DOCUSATE SODIUM 8.6-50 MG PO TABS: 1.0000 | ORAL_TABLET | Freq: Every evening | ORAL | Status: DC | PRN

## 2019-09-10 MED ORDER — SODIUM CHLORIDE 0.9 % IV SOLN: 250.0000 mL | INTRAVENOUS | Status: DC

## 2019-09-10 MED ORDER — BISACODYL 5 MG PO TBEC: 5.0000 mg | DELAYED_RELEASE_TABLET | Freq: Every day | ORAL | Status: DC | PRN

## 2019-09-10 MED ORDER — VANCOMYCIN HCL IN DEXTROSE 1-5 GM/200ML-% IV SOLN: 1000.0000 mg | INTRAVENOUS | Status: AC

## 2019-09-10 MED ORDER — BUPIVACAINE-EPINEPHRINE 0.25% -1:200000 IJ SOLN
INTRAMUSCULAR | Status: DC | PRN
  Administered 2019-09-10: 30 mL

## 2019-09-10 MED ORDER — MIDAZOLAM HCL 5 MG/5ML IJ SOLN
INTRAMUSCULAR | Status: DC | PRN
  Administered 2019-09-10: 2 mg via INTRAVENOUS

## 2019-09-10 MED ORDER — ROCURONIUM BROMIDE 10 MG/ML (PF) SYRINGE
PREFILLED_SYRINGE | INTRAVENOUS | Status: AC
  Filled 2019-09-10: qty 10

## 2019-09-10 MED ORDER — 0.9 % SODIUM CHLORIDE (POUR BTL) OPTIME
TOPICAL | Status: DC | PRN
  Administered 2019-09-10: 1000 mL

## 2019-09-10 MED ORDER — LIDOCAINE 2% (20 MG/ML) 5 ML SYRINGE
INTRAMUSCULAR | Status: AC
  Filled 2019-09-10: qty 5

## 2019-09-10 MED ORDER — ONDANSETRON HCL 4 MG/2ML IJ SOLN: 4.0000 mg | Freq: Four times a day (QID) | INTRAMUSCULAR | Status: DC | PRN

## 2019-09-10 MED ORDER — PANTOPRAZOLE SODIUM 40 MG PO TBEC
40.0000 mg | DELAYED_RELEASE_TABLET | Freq: Every day | ORAL | Status: DC
  Administered 2019-09-10: 21:00:00 40 mg via ORAL
  Filled 2019-09-10: qty 1

## 2019-09-10 MED ORDER — FENTANYL CITRATE (PF) 100 MCG/2ML IJ SOLN: 25.0000 ug | INTRAMUSCULAR | Status: DC | PRN

## 2019-09-10 MED ORDER — ZOLPIDEM TARTRATE 5 MG PO TABS: 5.0000 mg | ORAL_TABLET | Freq: Every evening | ORAL | Status: DC | PRN

## 2019-09-10 MED ORDER — OXYCODONE-ACETAMINOPHEN 5-325 MG PO TABS
1.0000 | ORAL_TABLET | ORAL | Status: DC | PRN
  Administered 2019-09-10 – 2019-09-11 (×4): 2 via ORAL
  Filled 2019-09-10 (×4): qty 2

## 2019-09-10 MED ORDER — MENTHOL 3 MG MT LOZG: 1.0000 | LOZENGE | OROMUCOSAL | Status: DC | PRN

## 2019-09-10 MED ORDER — PROMETHAZINE HCL 25 MG/ML IJ SOLN: 6.2500 mg | INTRAMUSCULAR | Status: DC | PRN

## 2019-09-10 MED ORDER — ACETAMINOPHEN 325 MG PO TABS: 650.0000 mg | ORAL_TABLET | ORAL | Status: DC | PRN

## 2019-09-10 MED ORDER — LIDOCAINE 2% (20 MG/ML) 5 ML SYRINGE
INTRAMUSCULAR | Status: DC | PRN
  Administered 2019-09-10: 50 mg via INTRAVENOUS

## 2019-09-10 MED ORDER — ONDANSETRON HCL 4 MG/2ML IJ SOLN
INTRAMUSCULAR | Status: DC | PRN
  Administered 2019-09-10: 4 mg via INTRAVENOUS

## 2019-09-10 MED ORDER — DEXMEDETOMIDINE HCL 200 MCG/2ML IV SOLN
INTRAVENOUS | Status: DC | PRN
  Administered 2019-09-10: 20 ug via INTRAVENOUS

## 2019-09-10 MED ORDER — ROCURONIUM BROMIDE 50 MG/5ML IV SOSY
PREFILLED_SYRINGE | INTRAVENOUS | Status: DC | PRN
  Administered 2019-09-10 (×2): 50 mg via INTRAVENOUS

## 2019-09-10 MED ORDER — ESMOLOL HCL 100 MG/10ML IV SOLN
INTRAVENOUS | Status: AC
  Filled 2019-09-10: qty 10

## 2019-09-10 MED ORDER — DEXAMETHASONE SODIUM PHOSPHATE 10 MG/ML IJ SOLN
INTRAMUSCULAR | Status: DC | PRN
  Administered 2019-09-10: 10 mg via INTRAVENOUS

## 2019-09-10 MED ORDER — ACETAMINOPHEN 650 MG RE SUPP: 650.0000 mg | RECTAL | Status: DC | PRN

## 2019-09-10 MED ORDER — PHENYLEPHRINE 40 MCG/ML (10ML) SYRINGE FOR IV PUSH (FOR BLOOD PRESSURE SUPPORT)
PREFILLED_SYRINGE | INTRAVENOUS | Status: AC
  Filled 2019-09-10: qty 10

## 2019-09-10 MED ORDER — FLEET ENEMA 7-19 GM/118ML RE ENEM: 1.0000 | ENEMA | Freq: Once | RECTAL | Status: DC | PRN

## 2019-09-10 MED ORDER — VANCOMYCIN HCL 1500 MG/300ML IV SOLN
1500.0000 mg | Freq: Once | INTRAVENOUS | Status: AC
  Administered 2019-09-10: 1500 mg via INTRAVENOUS
  Filled 2019-09-10: qty 300

## 2019-09-10 MED ORDER — ONDANSETRON HCL 4 MG/2ML IJ SOLN
INTRAMUSCULAR | Status: AC
  Filled 2019-09-10: qty 2

## 2019-09-10 MED ORDER — PHENOL 1.4 % MT LIQD: 1.0000 | OROMUCOSAL | Status: DC | PRN

## 2019-09-10 MED ORDER — PANTOPRAZOLE SODIUM 40 MG IV SOLR: 40.0000 mg | Freq: Every day | INTRAVENOUS | Status: DC

## 2019-09-10 MED ORDER — VANCOMYCIN HCL IN DEXTROSE 1-5 GM/200ML-% IV SOLN
INTRAVENOUS | Status: AC
  Administered 2019-09-10: 1000 mg via INTRAVENOUS
  Filled 2019-09-10: qty 200

## 2019-09-10 MED ORDER — POVIDONE-IODINE 7.5 % EX SOLN: Freq: Once | CUTANEOUS | Status: DC

## 2019-09-10 MED ORDER — PROMETHAZINE HCL 25 MG/ML IJ SOLN
INTRAMUSCULAR | Status: AC
  Administered 2019-09-10: 16:00:00 6.25 mg via INTRAVENOUS
  Filled 2019-09-10: qty 1

## 2019-09-10 MED ORDER — THROMBIN 20000 UNITS EX SOLR
OROMUCOSAL | Status: DC | PRN
  Administered 2019-09-10: 20 mL

## 2019-09-10 MED ORDER — BUPIVACAINE-EPINEPHRINE (PF) 0.25% -1:200000 IJ SOLN
INTRAMUSCULAR | Status: AC
  Filled 2019-09-10: qty 30

## 2019-09-10 MED ORDER — NORGESTIMATE-ETH ESTRADIOL 0.25-35 MG-MCG PO TABS: 1.0000 | ORAL_TABLET | Freq: Every day | ORAL | Status: DC

## 2019-09-10 SURGICAL SUPPLY — 73 items
BENZOIN TINCTURE PRP APPL 2/3 (GAUZE/BANDAGES/DRESSINGS) ×3 IMPLANT
BIT DRILL NEURO 2X3.1 SFT TUCH (MISCELLANEOUS) ×1 IMPLANT
BIT DRILL SKYLINE 12MM (BIT) IMPLANT
BLADE CLIPPER SURG (BLADE) ×3 IMPLANT
BLADE SURG 15 STRL LF DISP TIS (BLADE) ×1 IMPLANT
BLADE SURG 15 STRL SS (BLADE) ×2
BONE VIVIGEN FORMABLE 1.3CC (Bone Implant) ×3 IMPLANT
BUR MATCHSTICK NEURO 3.0 LAGG (BURR) IMPLANT
CARTRIDGE OIL MAESTRO DRILL (MISCELLANEOUS) ×1 IMPLANT
CLOSURE WOUND 1/2 X4 (GAUZE/BANDAGES/DRESSINGS) ×1
CORD BIPOLAR FORCEPS 12FT (ELECTRODE) ×3 IMPLANT
COVER SURGICAL LIGHT HANDLE (MISCELLANEOUS) ×3 IMPLANT
DIFFUSER DRILL AIR PNEUMATIC (MISCELLANEOUS) ×3 IMPLANT
DRAIN JACKSON RD 7FR 3/32 (WOUND CARE) IMPLANT
DRAPE C-ARM 42X72 X-RAY (DRAPES) ×3 IMPLANT
DRAPE POUCH INSTRU U-SHP 10X18 (DRAPES) ×3 IMPLANT
DRAPE SURG 17X23 STRL (DRAPES) ×9 IMPLANT
DRILL BIT SKYLINE 12MM (BIT) ×2
DRILL NEURO 2X3.1 SOFT TOUCH (MISCELLANEOUS) ×3
DURAPREP 26ML APPLICATOR (WOUND CARE) ×3 IMPLANT
ELECT COATED BLADE 2.86 ST (ELECTRODE) ×3 IMPLANT
ELECT REM PT RETURN 9FT ADLT (ELECTROSURGICAL) ×3
ELECTRODE REM PT RTRN 9FT ADLT (ELECTROSURGICAL) ×1 IMPLANT
EVACUATOR SILICONE 100CC (DRAIN) IMPLANT
GAUZE 4X4 16PLY RFD (DISPOSABLE) ×3 IMPLANT
GAUZE SPONGE 4X4 12PLY STRL (GAUZE/BANDAGES/DRESSINGS) ×3 IMPLANT
GLOVE BIO SURGEON STRL SZ7 (GLOVE) ×3 IMPLANT
GLOVE BIO SURGEON STRL SZ8 (GLOVE) ×3 IMPLANT
GLOVE BIOGEL PI IND STRL 7.0 (GLOVE) ×2 IMPLANT
GLOVE BIOGEL PI IND STRL 8 (GLOVE) ×1 IMPLANT
GLOVE BIOGEL PI INDICATOR 7.0 (GLOVE) ×4
GLOVE BIOGEL PI INDICATOR 8 (GLOVE) ×2
GOWN STRL REUS W/ TWL LRG LVL3 (GOWN DISPOSABLE) ×1 IMPLANT
GOWN STRL REUS W/ TWL XL LVL3 (GOWN DISPOSABLE) ×1 IMPLANT
GOWN STRL REUS W/TWL LRG LVL3 (GOWN DISPOSABLE) ×2
GOWN STRL REUS W/TWL XL LVL3 (GOWN DISPOSABLE) ×2
GRAFT BNE MATRIX VG FRMBL SM 1 (Bone Implant) IMPLANT
INTERLOCK LRDTC CRVCL VBR 8MM (Peek) IMPLANT
IV CATH 14GX2 1/4 (CATHETERS) ×3 IMPLANT
KIT BASIN OR (CUSTOM PROCEDURE TRAY) ×3 IMPLANT
KIT TURNOVER KIT B (KITS) ×3 IMPLANT
LORDOTIC CERVICAL VBR 8MM SM (Peek) ×3 IMPLANT
MANIFOLD NEPTUNE II (INSTRUMENTS) ×3 IMPLANT
NDL PRECISIONGLIDE 27X1.5 (NEEDLE) ×1 IMPLANT
NDL SPNL 20GX3.5 QUINCKE YW (NEEDLE) ×1 IMPLANT
NEEDLE PRECISIONGLIDE 27X1.5 (NEEDLE) ×3 IMPLANT
NEEDLE SPNL 20GX3.5 QUINCKE YW (NEEDLE) ×3 IMPLANT
NS IRRIG 1000ML POUR BTL (IV SOLUTION) ×3 IMPLANT
OIL CARTRIDGE MAESTRO DRILL (MISCELLANEOUS) ×3
PACK ORTHO CERVICAL (CUSTOM PROCEDURE TRAY) ×3 IMPLANT
PAD ARMBOARD 7.5X6 YLW CONV (MISCELLANEOUS) ×6 IMPLANT
PATTIES SURGICAL .5 X.5 (GAUZE/BANDAGES/DRESSINGS) IMPLANT
PATTIES SURGICAL .5 X1 (DISPOSABLE) IMPLANT
PLATE SKYLINE 12MM (Plate) ×2 IMPLANT
POSITIONER HEAD DONUT 9IN (MISCELLANEOUS) ×3 IMPLANT
SCREW SKYLINE VARIABLE LG (Screw) ×8 IMPLANT
SPONGE INTESTINAL PEANUT (DISPOSABLE) ×3 IMPLANT
SPONGE SURGIFOAM ABS GEL 100 (HEMOSTASIS) IMPLANT
STRIP CLOSURE SKIN 1/2X4 (GAUZE/BANDAGES/DRESSINGS) ×2 IMPLANT
SURGIFLO W/THROMBIN 8M KIT (HEMOSTASIS) IMPLANT
SUT MNCRL AB 4-0 PS2 18 (SUTURE) IMPLANT
SUT SILK 4 0 (SUTURE)
SUT SILK 4-0 18XBRD TIE 12 (SUTURE) IMPLANT
SUT VIC AB 2-0 CT2 18 VCP726D (SUTURE) ×3 IMPLANT
SYR BULB IRRIGATION 50ML (SYRINGE) ×3 IMPLANT
SYR CONTROL 10ML LL (SYRINGE) ×6 IMPLANT
TAPE CLOTH 4X10 WHT NS (GAUZE/BANDAGES/DRESSINGS) ×3 IMPLANT
TAPE CLOTH SURG 4X10 WHT LF (GAUZE/BANDAGES/DRESSINGS) ×2 IMPLANT
TAPE UMBILICAL COTTON 1/8X30 (MISCELLANEOUS) ×3 IMPLANT
TOWEL GREEN STERILE (TOWEL DISPOSABLE) ×3 IMPLANT
TOWEL GREEN STERILE FF (TOWEL DISPOSABLE) ×3 IMPLANT
WATER STERILE IRR 1000ML POUR (IV SOLUTION) ×3 IMPLANT
YANKAUER SUCT BULB TIP NO VENT (SUCTIONS) ×3 IMPLANT

## 2019-09-10 NOTE — Anesthesia Postprocedure Evaluation (Signed)
Anesthesia Post Note  Patient: Abbrielle Batts  Procedure(s) Performed: ANTERIOR CERVICAL DECOMPRESSION FUSION CERVICAL SIX-SEVEN WITH INSTRUMENTATION AND ALLOGRAFT (N/A Spine Cervical)     Patient location during evaluation: PACU Anesthesia Type: General Level of consciousness: awake and alert Pain management: pain level controlled Vital Signs Assessment: post-procedure vital signs reviewed and stable Respiratory status: spontaneous breathing, nonlabored ventilation, respiratory function stable and patient connected to nasal cannula oxygen Cardiovascular status: blood pressure returned to baseline and stable Postop Assessment: no apparent nausea or vomiting Anesthetic complications: no    Last Vitals:  Vitals:   09/10/19 1644 09/10/19 1701  BP: (!) 128/91 137/78  Pulse: 100 (!) 104  Resp: 17 16  Temp: 36.4 C (!) 36.4 C  SpO2: 98% 100%    Last Pain:  Vitals:   09/10/19 1701  TempSrc: Oral  PainSc:                  Effie Berkshire

## 2019-09-10 NOTE — Op Note (Signed)
PATIENT NAME: Victoria Bullock   MEDICAL RECORD NO.:   [id number]    DATE OF BIRTH: 04/25/1986  DATE OF PROCEDURE: 09/10/2019                               OPERATIVE REPORT     PREOPERATIVE DIAGNOSES: 1. Left-sided cervical radiculopathy. 2. 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   POSTOPERATIVE DIAGNOSES: 1. Left-sided cervical radiculopathy. 2. 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   PROCEDURE: 1. Anterior cervical decompression and fusion C6/7. 2. Placement of anterior instrumentation, C6/7. 3. Insertion of interbody device x1 (38mm Titan intervertebral spacer). 4. Intraoperative use of fluoroscopy. 5. Use of morselized allograft - ViviGen.   SURGEON:  Estill Bamberg, MD   ASSISTANT:  Jason Coop, PA-C.   ANESTHESIA:  General endotracheal anesthesia.   COMPLICATIONS:  None.   DISPOSITION:  Stable.   ESTIMATED BLOOD LOSS:  Minimal.   INDICATIONS FOR SURGERY:  Briefly, Ms. Carmen is a pleasant 33 -year- old female, who did present to me with severe pain in her neck and left arm.   The patient's MRI did reveal the findings noted above.  Given the ongoing rather debilitating pain and lack of improvement with appropriate treatment measures, we did discuss proceeding with the procedure noted above.  The patient was fully aware of the risks and limitations of surgery as outlined in my preoperative note.   OPERATIVE DETAILS:  On 09/10/2019 , the patient was brought to surgery and general endotracheal anesthesia was administered.  The patient was placed supine on the hospital bed. The neck was gently extended.  All bony prominences were meticulously padded.  The neck was prepped and draped in the usual sterile fashion.  At this point, I did make a left-sided transverse incision.  The platysma was incised.  A Smith-Robinson approach was used and the anterior spine was identified. A self-retaining retractor was placed.  I then subperiosteally exposed the vertebral bodies from  C6-C7.  Caspar pins were then placed into the C6 and C7 vertebral bodies and distraction was applied.  A thorough and complete C6-7 intervertebral diskectomy was performed.  The posterior longitudinal ligament was identified and entered using a nerve hook.  I then used #1 followed by #2 Kerrison to perform a thorough and complete intervertebral diskectomy. The spinal canal was thoroughly decompressed, as was the left neuroforamen. A large left C6/7 disc fragment was noted as was removed. The endplates were then prepared and the appropriate-sized intervertebral spacer was then packed with ViviGen and tamped into position in the usual fashion. The Caspar pins  then were removed and bone wax was placed in their place.  The appropriate-sized anterior cervical plate was placed over the anterior spine. 12 mm variable angle screws were placed, 2 in each vertebral body from C6-C7 for a total of 4 vertebral body screws.  The screws were then locked to the plate using the Cam locking mechanism.  I was very pleased with the final fluoroscopic images.  The wound was then irrigated.  The wound was then explored for any undue bleeding and there was no bleeding noted. The wound was then closed in layers using 2-0 Vicryl, followed by 4-0 Monocryl.  Benzoin and Steri-Strips were applied, followed by sterile dressing.  All instrument counts were correct at the termination of the procedure.   Of note, Jason Coop, PA-C, was my assistant throughout surgery, and did aid in retraction, suctioning, and closure from start to  finish.         Phylliss Bob, MD

## 2019-09-10 NOTE — Transfer of Care (Signed)
Immediate Anesthesia Transfer of Care Note  Patient: Victoria Bullock  Procedure(s) Performed: ANTERIOR CERVICAL DECOMPRESSION FUSION CERVICAL SIX-SEVEN WITH INSTRUMENTATION AND ALLOGRAFT (N/A Spine Cervical)  Patient Location: PACU  Anesthesia Type:General  Level of Consciousness: drowsy and patient cooperative  Airway & Oxygen Therapy: Patient Spontanous Breathing  Post-op Assessment: Report given to RN and Post -op Vital signs reviewed and stable  Post vital signs: Reviewed and stable  Last Vitals:  Vitals Value Taken Time  BP 134/77 09/10/19 1536  Temp    Pulse 121 09/10/19 1537  Resp 21 09/10/19 1537  SpO2 97 % 09/10/19 1537  Vitals shown include unvalidated device data.  Last Pain:  Vitals:   09/10/19 1102  PainSc: 5       Patients Stated Pain Goal: 3 (62/13/08 6578)  Complications: No apparent anesthesia complications

## 2019-09-10 NOTE — Anesthesia Preprocedure Evaluation (Addendum)
Anesthesia Evaluation  Patient identified by MRN, date of birth, ID band Patient awake    Reviewed: Allergy & Precautions, NPO status , Patient's Chart, lab work & pertinent test results  Airway Mallampati: I  TM Distance: >3 FB Neck ROM: Full    Dental  (+) Poor Dentition, Chipped,    Pulmonary neg pulmonary ROS,    Pulmonary exam normal        Cardiovascular negative cardio ROS   Rhythm:Regular Rate:Normal     Neuro/Psych  Headaches, PSYCHIATRIC DISORDERS Anxiety Depression    GI/Hepatic negative GI ROS, Neg liver ROS,   Endo/Other  negative endocrine ROS  Renal/GU negative Renal ROS  negative genitourinary   Musculoskeletal negative musculoskeletal ROS (+)   Abdominal Normal abdominal exam  (+)   Peds  Hematology negative hematology ROS (+)   Anesthesia Other Findings left C6-C7 disc herniation, severely compressing the exiting left C7 nerve  Reproductive/Obstetrics                          Anesthesia Physical Anesthesia Plan  ASA: II  Anesthesia Plan: General   Post-op Pain Management:    Induction: Intravenous  PONV Risk Score and Plan: 3 and Midazolam, Dexamethasone and Ondansetron  Airway Management Planned: Oral ETT and Video Laryngoscope Planned  Additional Equipment:   Intra-op Plan:   Post-operative Plan: Extubation in OR  Informed Consent: I have reviewed the patients History and Physical, chart, labs and discussed the procedure including the risks, benefits and alternatives for the proposed anesthesia with the patient or authorized representative who has indicated his/her understanding and acceptance.     Dental advisory given  Plan Discussed with: CRNA  Anesthesia Plan Comments:        Anesthesia Quick Evaluation

## 2019-09-10 NOTE — H&P (Signed)
PREOPERATIVE H&P  Chief Complaint: Left arm pain  HPI: Victoria Bullock is a 33 y.o. female who presents with ongoing pain in the left arm, as well as numbness in the left arm, for the last few weeks.  The pain continues to extend into the left index and long finger.  Her pain is been severe and substantial.  Her weaknessis substantial as well.  MRI reveals a large left C6-C7 disc herniation, severely compressing the exiting left C7 nerve.  Patient has failed multiple forms of conservative care and continues to have pain (see office notes for additional details regarding the patient's full course of treatment)  Past Medical History:  Diagnosis Date  . Anxiety   . Depression   . Headache    in the past   Past Surgical History:  Procedure Laterality Date  . TONSILLECTOMY     Social History   Socioeconomic History  . Marital status: Single    Spouse name: Not on file  . Number of children: Not on file  . Years of education: Not on file  . Highest education level: Not on file  Occupational History  . Not on file  Tobacco Use  . Smoking status: Never Smoker  . Smokeless tobacco: Never Used  Substance and Sexual Activity  . Alcohol use: Yes    Comment: occ  . Drug use: Never  . Sexual activity: Not on file  Other Topics Concern  . Not on file  Social History Narrative  . Not on file   Social Determinants of Health   Financial Resource Strain:   . Difficulty of Paying Living Expenses: Not on file  Food Insecurity:   . Worried About Charity fundraiser in the Last Year: Not on file  . Ran Out of Food in the Last Year: Not on file  Transportation Needs:   . Lack of Transportation (Medical): Not on file  . Lack of Transportation (Non-Medical): Not on file  Physical Activity:   . Days of Exercise per Week: Not on file  . Minutes of Exercise per Session: Not on file  Stress:   . Feeling of Stress : Not on file  Social Connections:   . Frequency of  Communication with Friends and Family: Not on file  . Frequency of Social Gatherings with Friends and Family: Not on file  . Attends Religious Services: Not on file  . Active Member of Clubs or Organizations: Not on file  . Attends Archivist Meetings: Not on file  . Marital Status: Not on file   No family history on file. Allergies  Allergen Reactions  . Amoxicillin Hives and Itching    Did it involve swelling of the face/tongue/throat, SOB, or low BP? No Did it involve sudden or severe rash/hives, skin peeling, or any reaction on the inside of your mouth or nose? No Did you need to seek medical attention at a hospital or doctor's office? No When did it last happen?childhood allergy If all above answers are "NO", may proceed with cephalosporin use.    Prior to Admission medications   Medication Sig Start Date End Date Taking? Authorizing Provider  acetaminophen (TYLENOL) 325 MG tablet Take 650 mg by mouth every 6 (six) hours as needed for moderate pain or headache.   Yes [provider]  cyclobenzaprine (FLEXERIL) 10 MG tablet One half to one tab PO qHS, then increase gradually to one tab TID. Patient taking differently: Take 10 mg by mouth 3 (  three) times daily.  08/27/19  Yes Monica Becton, MD  ibuprofen (ADVIL) 200 MG tablet Take 800 mg by mouth every 8 (eight) hours as needed for headache or moderate pain.   Yes [provider]  Melatonin 5 MG CAPS Take 5 mg by mouth at bedtime as needed (sleep).   Yes [provider]  norgestimate-ethinyl estradiol (ORTHO-CYCLEN) 0.25-35 MG-MCG tablet Take 1 tablet by mouth daily.   Yes [provider]  PERCOCET 5-325 MG tablet Take 1 tablet by mouth every 8 (eight) hours as needed for pain. 09/02/19  Yes [provider]  diclofenac (VOLTAREN) 75 MG EC tablet Take 1 tablet (75 mg total) by mouth 2 (two) times daily. Patient not taking: Reported on 09/03/2019 08/22/19   Elson Areas, PA-C  oxyCODONE-acetaminophen (PERCOCET) 10-325 MG tablet Take 1 tablet by mouth every 8 (eight) hours as needed for pain. Patient not taking: Reported on 09/03/2019 08/27/19   Monica Becton, MD  predniSONE (DELTASONE) 20 MG tablet Take one tab by mouth twice daily for 4 days, then one daily for 3 days. Take with food. Patient not taking: Reported on 09/03/2019 08/24/19   Lattie Haw, MD     All other systems have been reviewed and were otherwise negative with the exception of those mentioned in the HPI and as above.  Physical Exam: There were no vitals filed for this visit.  There is no height or weight on file to calculate BMI.  General: Alert, no acute distress Cardiovascular: No pedal edema Respiratory: No cyanosis, no use of accessory musculature Skin: No lesions in the area of chief complaint Neurologic: Sensation intact distally Psychiatric: Patient is competent for consent with normal mood and affect Lymphatic: No axillary or cervical lymphadenopathy  MUSCULOSKELETAL: positive Spurling sign noted on the left  Assessment/Plan: LEFT CERVICAL 7 RADICULOPATHY SECONDARY TO LARGE LEFT CERVICAL 6-7 DISC HERNIATION Plan for Procedure(s): ANTERIOR CERVICAL DECOMPRESSION FUSION CERVICAL 6-7 WITH INSTRUMENTATION AND ALLOGRAFT   Jackelyn Hoehn, MD 09/10/2019 9:18 AM

## 2019-09-10 NOTE — Anesthesia Procedure Notes (Addendum)
Procedure Name: Intubation Date/Time: 09/10/2019 1:53 PM Performed by: Lance Coon, CRNA Pre-anesthesia Checklist: Patient identified, Emergency Drugs available, Suction available, Patient being monitored and Timeout performed Patient Re-evaluated:Patient Re-evaluated prior to induction Oxygen Delivery Method: Circle system utilized Preoxygenation: Pre-oxygenation with 100% oxygen Induction Type: IV induction Ventilation: Mask ventilation without difficulty Laryngoscope Size: Glidescope and 4 Grade View: Grade I Tube type: Oral Tube size: 7.0 mm Number of attempts: 1 Airway Equipment and Method: Stylet and Video-laryngoscopy Placement Confirmation: ETT inserted through vocal cords under direct vision,  positive ETCO2 and breath sounds checked- equal and bilateral Secured at: 21 cm Tube secured with: Tape Dental Injury: Teeth and Oropharynx as per pre-operative assessment

## 2019-09-10 NOTE — Progress Notes (Signed)
Pharmacy Antibiotic Note  Victoria Bullock is a 33 y.o. female admitted on 09/10/2019 with surgical prophylaxis.  Pharmacy has been consulted for vancomycin dosing.  There does not appear to be a drain in place.  Plan: Vancomycin 1500 mg x 1 tonight at 11 PM. Pharmacy will sign off, please contact if questions.  Height: 5\' 5"  (165.1 cm) Weight: 215 lb (97.5 kg) IBW/kg (Calculated) : 57  Temp (24hrs), Avg:97.9 F (36.6 C), Min:97.5 F (36.4 C), Max:98.5 F (36.9 C)  Recent Labs  Lab 09/06/19 1100  WBC 11.2*  CREATININE 0.77    Estimated Creatinine Clearance: 115.6 mL/min (by C-G formula based on SCr of 0.77 mg/dL).    Allergies  Allergen Reactions  . Amoxicillin Hives and Itching    Did it involve swelling of the face/tongue/throat, SOB, or low BP? No Did it involve sudden or severe rash/hives, skin peeling, or any reaction on the inside of your mouth or nose? No Did you need to seek medical attention at a hospital or doctor's office? No When did it last happen?childhood allergy If all above answers are "NO", may proceed with cephalosporin use.     Thank you for allowing pharmacy to be a part of this patient's care.  Marguerite Olea, [facility] Clinical Pharmacist Phone [phone]  09/10/2019 5:45 PM

## 2019-09-10 NOTE — Progress Notes (Signed)
Dr. Lanetta Inch from anesthesia made aware of pt's HR ranging from 109-120. Pt in NAD, cardiac monitor assessing.

## 2019-09-11 DIAGNOSIS — M50123 Cervical disc disorder at C6-C7 level with radiculopathy: Secondary | ICD-10-CM | POA: Diagnosis not present

## 2019-09-11 MED ORDER — OXYCODONE-ACETAMINOPHEN 5-325 MG PO TABS: 1.0000 | ORAL_TABLET | ORAL | 0 refills | Status: AC | PRN

## 2019-09-11 MED ORDER — CYCLOBENZAPRINE HCL 10 MG PO TABS: 10.0000 mg | ORAL_TABLET | Freq: Three times a day (TID) | ORAL | 0 refills | Status: AC

## 2019-09-11 NOTE — Progress Notes (Signed)
    Patient doing well  Denies arm pain Tolerating PO well   Physical Exam: Vitals:   09/10/19 2318 09/11/19 0411  BP: 134/89 138/88  Pulse: (!) 111 93  Resp: 18 18  Temp: 98.1 F (36.7 C) 97.7 F (36.5 C)  SpO2: 98% 100%    Neck soft/supple Dressing in place NVI  POD #1 s/p ACDF, doing well  - encourage ambulation - Percocet for pain, Flexeril for muscle spasms - d/c home today with f/u in 2 weeks

## 2019-09-11 NOTE — Plan of Care (Signed)
Patient alert and oriented, mae's well, voiding adequate amount of urine, swallowing without difficulty, no c/o pain at time of discharge. Patient discharged home with family. Script and discharged instructions given to patient. Patient and family stated understanding of instructions given. Patient has an appointment with Dr. Dumonski 

## 2019-09-12 ENCOUNTER — Encounter: Payer: Self-pay | Admitting: *Deleted

## 2019-09-17 ENCOUNTER — Other Ambulatory Visit: Payer: Self-pay

## 2019-09-17 DIAGNOSIS — M5412 Radiculopathy, cervical region: Secondary | ICD-10-CM

## 2019-09-17 MED ORDER — OXYCODONE-ACETAMINOPHEN 10-325 MG PO TABS: 1.0000 | ORAL_TABLET | Freq: Three times a day (TID) | ORAL | 0 refills | Status: AC | PRN

## 2019-09-17 MED ORDER — CYCLOBENZAPRINE HCL 10 MG PO TABS: ORAL_TABLET | ORAL | 0 refills | Status: AC

## 2019-09-24 ENCOUNTER — Ambulatory Visit: Payer: PRIVATE HEALTH INSURANCE | Admitting: Sports Medicine

## 2019-09-25 ENCOUNTER — Other Ambulatory Visit: Payer: Self-pay

## 2019-09-25 DIAGNOSIS — M5412 Radiculopathy, cervical region: Secondary | ICD-10-CM

## 2019-09-25 NOTE — Telephone Encounter (Signed)
Postoperative pain control should be discussed with surgeon

## 2020-08-24 IMAGING — RF DG CERVICAL SPINE 2 OR 3 VIEWS
1 series · 4 of 4 positions shown · non-contrast
Comparison: 08/24/2019

CLINICAL DATA: ACDF

EXAM:
DG C-ARM 1-60 MIN; CERVICAL SPINE - 2-3 VIEW

[Series 1: run · 0.14mm/px · 4 of 4 slices shown]
[im 1/4]
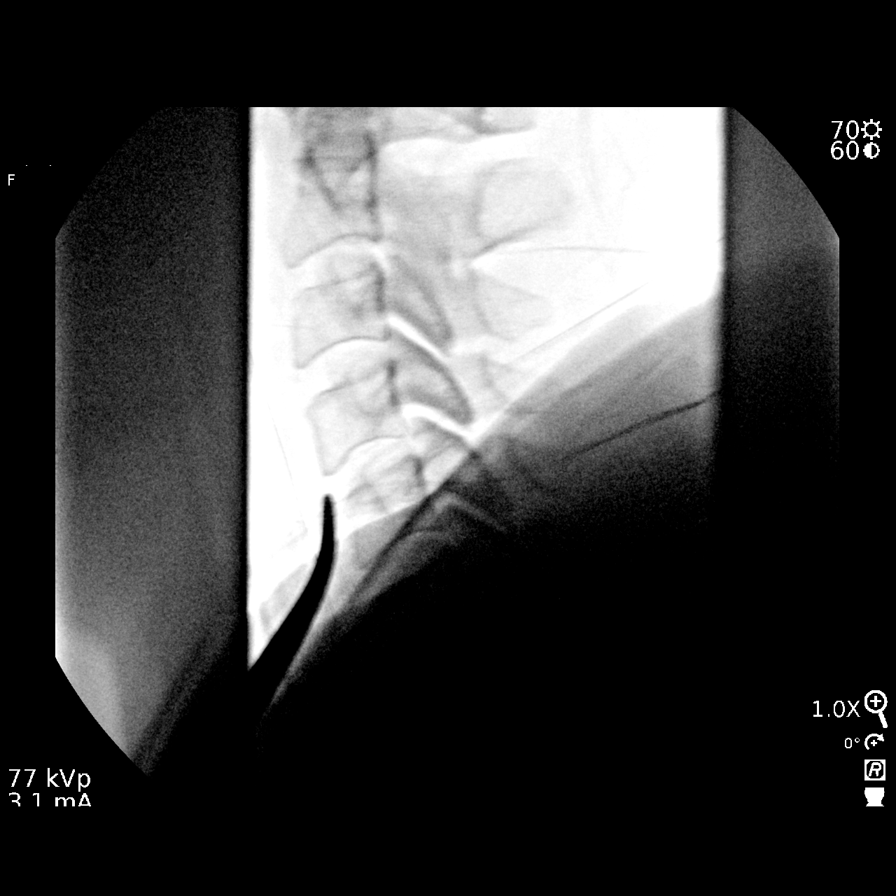
[im 2/4]
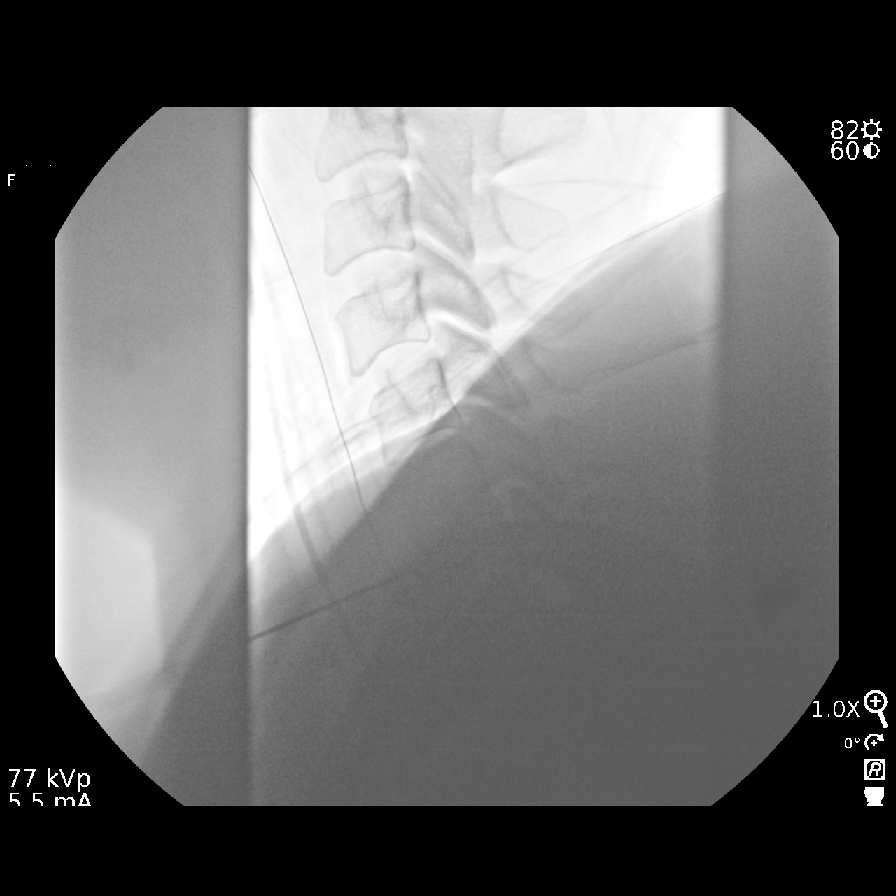
[im 3/4]
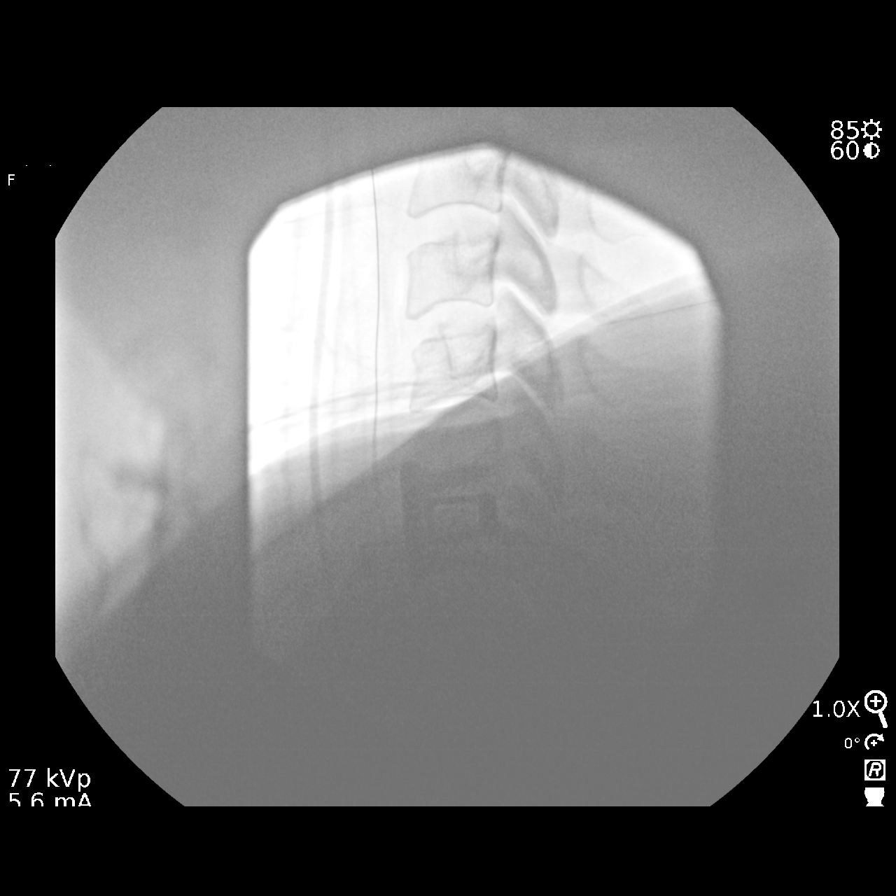
[im 4/4]
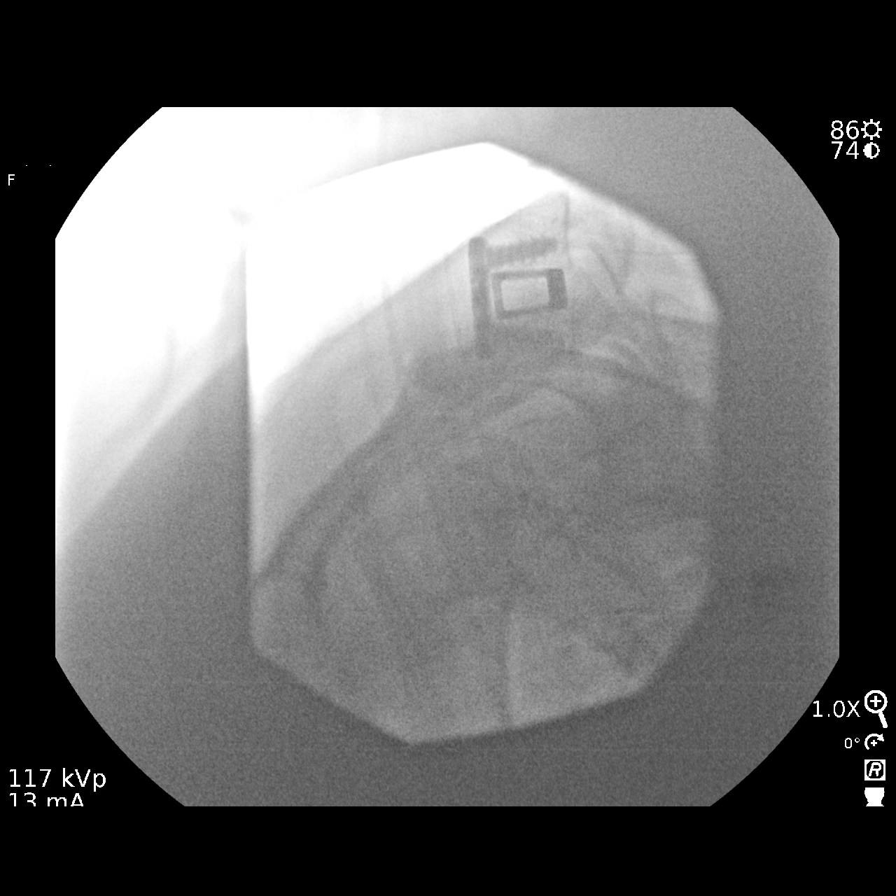

[4 of 4 positions shown; findings below may reference images not displayed]

FINDINGS: Multiple intraoperative spot images demonstrate anterior
localization at C6-7. Single level anterior fusion noted presumably
at C6-7 although difficult to determine exact level on these
intraoperative spot images.
IMPRESSION: Single level lower cervical anterior fusion. No visible complicating
feature.
